# Patient Record
Sex: Male | Born: 2000 | Race: Black or African American | Hispanic: No | Marital: Single | Smoking: Never smoker
Health system: Southern US, Community
[De-identification: ages and names within clinical notes are randomized; demographics above are authoritative.]

---
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
## 2018-12-09 DIAGNOSIS — F1210 Cannabis abuse, uncomplicated: Secondary | ICD-10-CM | POA: Diagnosis not present

## 2018-12-23 DIAGNOSIS — F1210 Cannabis abuse, uncomplicated: Secondary | ICD-10-CM | POA: Diagnosis not present

## 2020-01-17 DIAGNOSIS — H5213 Myopia, bilateral: Secondary | ICD-10-CM | POA: Diagnosis not present

## 2023-09-01 ENCOUNTER — Other Ambulatory Visit: Payer: Self-pay | Admitting: Nurse Practitioner

## 2023-09-01 ENCOUNTER — Ambulatory Visit
Admission: RE | Admit: 2023-09-01 | Discharge: 2023-09-01 | Disposition: A | Discharge status: Home / Self Care | Payer: Self-pay | Source: Ambulatory Visit | Attending: Nurse Practitioner | Admitting: Nurse Practitioner

## 2023-09-01 DIAGNOSIS — Z021 Encounter for pre-employment examination: Secondary | ICD-10-CM

## 2023-10-19 ENCOUNTER — Other Ambulatory Visit: Payer: Self-pay

## 2023-10-19 ENCOUNTER — Emergency Department (HOSPITAL_COMMUNITY)

## 2023-10-19 ENCOUNTER — Encounter (HOSPITAL_COMMUNITY): Payer: Self-pay | Admitting: Pharmacy Technician

## 2023-10-19 ENCOUNTER — Emergency Department (HOSPITAL_COMMUNITY): Admission: EM | Admit: 2023-10-19 | Discharge: 2023-10-19 | Disposition: A | Discharge status: Home / Self Care

## 2023-10-19 DIAGNOSIS — S4991XA Unspecified injury of right shoulder and upper arm, initial encounter: Secondary | ICD-10-CM | POA: Diagnosis present

## 2023-10-19 DIAGNOSIS — H1132 Conjunctival hemorrhage, left eye: Secondary | ICD-10-CM | POA: Insufficient documentation

## 2023-10-19 DIAGNOSIS — S058X2A Other injuries of left eye and orbit, initial encounter: Secondary | ICD-10-CM | POA: Diagnosis not present

## 2023-10-19 DIAGNOSIS — S40011A Contusion of right shoulder, initial encounter: Secondary | ICD-10-CM | POA: Insufficient documentation

## 2023-10-19 DIAGNOSIS — S0512XA Contusion of eyeball and orbital tissues, left eye, initial encounter: Secondary | ICD-10-CM

## 2023-10-19 MED ORDER — FLUORESCEIN SODIUM 1 MG OP STRP
1.0000 | ORAL_STRIP | Freq: Once | OPHTHALMIC | Status: AC
Start: 2023-10-19 — End: 2023-10-19
  Administered 2023-10-19: 1 via OPHTHALMIC
  Filled 2023-10-19: qty 1

## 2023-10-19 MED ORDER — OXYCODONE-ACETAMINOPHEN 5-325 MG PO TABS
1.0000 | ORAL_TABLET | Freq: Once | ORAL | Status: AC
  Administered 2023-10-19: 1 via ORAL
  Filled 2023-10-19: qty 1

## 2023-10-19 MED ORDER — OXYCODONE-ACETAMINOPHEN 5-325 MG PO TABS: 1.0000 | ORAL_TABLET | Freq: Four times a day (QID) | ORAL | 0 refills | Status: AC | PRN

## 2023-10-19 MED ORDER — IBUPROFEN 600 MG PO TABS: 600.0000 mg | ORAL_TABLET | Freq: Four times a day (QID) | ORAL | 0 refills | Status: AC | PRN

## 2023-10-19 MED ORDER — PROPARACAINE HCL 0.5 % OP SOLN
2.0000 [drp] | Freq: Once | OPHTHALMIC | Status: AC
  Administered 2023-10-19: 2 [drp] via OPHTHALMIC
  Filled 2023-10-19: qty 15
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
## 2023-10-19 NOTE — Discharge Instructions (Signed)
 As we discussed, follow up with Dr. Charmayne in his office in the morning at 8:15. Let them know you have been referred by the emergency department and Dr. Charmayne will see him at that time. Recommend sleeping on an incline tonight to help reduce facial swelling. Ice packs as instructed. Take medications as prescribed for comfort.   Return to the ED any time with any new or concerning symptoms.
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
## 2023-12-29 ENCOUNTER — Encounter (HOSPITAL_COMMUNITY): Payer: Self-pay

## 2023-12-29 ENCOUNTER — Emergency Department (HOSPITAL_COMMUNITY)

## 2023-12-29 ENCOUNTER — Other Ambulatory Visit: Payer: Self-pay

## 2023-12-29 ENCOUNTER — Emergency Department (HOSPITAL_COMMUNITY)
Admission: EM | Admit: 2023-12-29 | Discharge: 2023-12-29 | Disposition: A | Discharge status: Home / Self Care | Attending: Emergency Medicine | Admitting: Emergency Medicine

## 2023-12-29 DIAGNOSIS — N289 Disorder of kidney and ureter, unspecified: Secondary | ICD-10-CM | POA: Diagnosis not present

## 2023-12-29 DIAGNOSIS — R079 Chest pain, unspecified: Secondary | ICD-10-CM | POA: Insufficient documentation

## 2023-12-29 LAB — CBC
HCT: 42.1 % (ref 39.0–52.0)
Hemoglobin: 13.9 g/dL (ref 13.0–17.0)
MCH: 30.6 pg (ref 26.0–34.0)
MCHC: 33 g/dL (ref 30.0–36.0)
MCV: 92.7 fL (ref 80.0–100.0)
Platelets: 383 K/uL (ref 150–400)
RBC: 4.54 MIL/uL (ref 4.22–5.81)
RDW: 12.1 % (ref 11.5–15.5)
WBC: 7.8 K/uL (ref 4.0–10.5)
nRBC: 0 % (ref 0.0–0.2)

## 2023-12-29 LAB — BASIC METABOLIC PANEL WITH GFR
Anion gap: 9 (ref 5–15)
BUN: 21 mg/dL — ABNORMAL HIGH (ref 6–20)
CO2: 26 mmol/L (ref 22–32)
Calcium: 9.3 mg/dL (ref 8.9–10.3)
Chloride: 104 mmol/L (ref 98–111)
Creatinine, Ser: 1.46 mg/dL — ABNORMAL HIGH (ref 0.61–1.24)
GFR, Estimated: >60 mL/min (ref >60)
Glucose, Bld: 86 mg/dL (ref 70–99)
Potassium: 4.7 mmol/L (ref 3.5–5.1)
Sodium: 139 mmol/L (ref 135–145)

## 2023-12-29 LAB — TROPONIN I (HIGH SENSITIVITY): Troponin I (High Sensitivity): 4 ng/L (ref <18)

## 2023-12-29 NOTE — Discharge Instructions (Addendum)
 Tylenol  up to 650 mg every 6 hours as needed for pain, your kidney test measured at 1.46 creatinine, this needs to be rechecked within 2 weeks to make sure that it is not getting any higher.  Please drink plenty of clear liquids, drink enough water so that your urine looks like water, if it gets too yellow that means you are not drinking enough water.  Please make sure that your blood gets drawn either at a family doctor or urgent care within 2 weeks  Emergency department for severe worsening symptoms
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
